# Patient Record
Sex: Male | Born: 1985 | Race: White | Hispanic: No | Marital: Single | Smoking: Current every day smoker
Health system: Southern US, Community
[De-identification: ages and names within clinical notes are randomized; demographics above are authoritative.]

---
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
## 2008-07-11 ENCOUNTER — Emergency Department (HOSPITAL_COMMUNITY): Admission: EM | Admit: 2008-07-11 | Discharge: 2008-07-11 | Payer: Self-pay | Admitting: Emergency Medicine

## 2012-10-11 ENCOUNTER — Emergency Department (HOSPITAL_COMMUNITY)
Admission: EM | Admit: 2012-10-11 | Discharge: 2012-10-11 | Disposition: A | Discharge status: Home / Self Care | Payer: Self-pay | Attending: Emergency Medicine | Admitting: Emergency Medicine

## 2012-10-11 ENCOUNTER — Encounter (HOSPITAL_COMMUNITY): Payer: Self-pay | Admitting: Emergency Medicine

## 2012-10-11 DIAGNOSIS — Y9389 Activity, other specified: Secondary | ICD-10-CM | POA: Insufficient documentation

## 2012-10-11 DIAGNOSIS — F17200 Nicotine dependence, unspecified, uncomplicated: Secondary | ICD-10-CM | POA: Insufficient documentation

## 2012-10-11 DIAGNOSIS — S30860A Insect bite (nonvenomous) of lower back and pelvis, initial encounter: Secondary | ICD-10-CM | POA: Insufficient documentation

## 2012-10-11 DIAGNOSIS — W57XXXA Bitten or stung by nonvenomous insect and other nonvenomous arthropods, initial encounter: Secondary | ICD-10-CM | POA: Insufficient documentation

## 2012-10-11 DIAGNOSIS — Y92009 Unspecified place in unspecified non-institutional (private) residence as the place of occurrence of the external cause: Secondary | ICD-10-CM | POA: Insufficient documentation

## 2012-10-11 NOTE — ED Notes (Signed)
Pt to ED with c/o tick bite at posterior penis. Pt states he was doing yard work yesterday when he was bitten by tick. Pt states he tried to removed but rest of tick broken in. Pt denies pain or itch.

## 2012-10-11 NOTE — ED Provider Notes (Signed)
Medical screening examination/treatment/procedure(s) were performed by non-physician practitioner and as supervising physician I was immediately available for consultation/collaboration.  Flint Melter, MD 10/11/12 [phone]

## 2012-10-11 NOTE — ED Provider Notes (Signed)
History     CSN: [id number]  Arrival date & time 10/11/12  0929   First MD Initiated Contact with Patient 10/11/12 0957      Chief Complaint  Patient presents with  . Tick Removal    (Consider location/radiation/quality/duration/timing/severity/associated sxs/prior treatment) HPI Comments: 27 year old male presents emergency department with a tick bite on his penis. Patient states he was doing yard work yesterday and believes he was bitten by a tick, later in the evening he noticed this in the shower. He tried to remove this on his own, however states he feels as if they're still part of it and her. Denies pain, redness, swelling or any other symptoms. The tick was very small, does not know what type of take it was.  The history is provided by the patient.    History reviewed. No pertinent past medical history.  History reviewed. No pertinent past surgical history.  History reviewed. No pertinent family history.  History  Substance Use Topics  . Smoking status: Current Every Day Smoker -- 1.00 packs/day    Types: Cigarettes  . Smokeless tobacco: Not on file  . Alcohol Use: Yes     Comment: occ      Review of Systems  Skin: Positive for wound.  All other systems reviewed and are negative.    Allergies  Review of patient's allergies indicates no known allergies.  Home Medications   Current Outpatient Rx  Name  Route  Sig  Dispense  Refill  . ibuprofen (ADVIL,MOTRIN) 200 MG tablet   Oral   Take 600 mg by mouth every 6 (six) hours as needed for pain.           There were no vitals taken for this visit.  Physical Exam  Nursing note and vitals reviewed. Constitutional: He is oriented to person, place, and time. He appears well-developed and well-nourished. No distress.  HENT:  Head: Normocephalic and atraumatic.  Mouth/Throat: Oropharynx is clear and moist.  Eyes: Conjunctivae are normal.  Neck: Normal range of motion. Neck supple.  Cardiovascular: Normal  rate, regular rhythm, normal heart sounds and intact distal pulses.   Pulmonary/Chest: Effort normal and breath sounds normal. No respiratory distress.  Genitourinary:  1 mm area of erythema on posterior aspect of penis with remnants of tick centrally. Tick removed, no surrounding edema. No tenderness.   Musculoskeletal: Normal range of motion. He exhibits no edema.  Neurological: He is alert and oriented to person, place, and time.  Skin: Skin is warm and dry. He is not diaphoretic.  Psychiatric: He has a normal mood and affect. His behavior is normal.    ED Course  Procedures (including critical care time) Tick removed with forceps. Labs Reviewed - No data to display No results found.   1. Tick bite       MDM  27 y/o male with tick bite to penis. Tick removed. No other symptoms present. I discussed all the symptoms to watch for with developing infection or Lyme disease. Patient states understanding of these instructions to return to the emergency department. He is in no apparent distress. Stable for discharge.        Trevor Mace, PA-C 10/11/12 1018

## 2014-01-28 ENCOUNTER — Emergency Department (HOSPITAL_COMMUNITY)
Admission: EM | Admit: 2014-01-28 | Discharge: 2014-01-28 | Disposition: A | Discharge status: Home / Self Care | Payer: Self-pay | Attending: Emergency Medicine | Admitting: Emergency Medicine

## 2014-01-28 ENCOUNTER — Encounter (HOSPITAL_COMMUNITY): Payer: Self-pay | Admitting: Emergency Medicine

## 2014-01-28 DIAGNOSIS — K089 Disorder of teeth and supporting structures, unspecified: Secondary | ICD-10-CM | POA: Insufficient documentation

## 2014-01-28 DIAGNOSIS — K0889 Other specified disorders of teeth and supporting structures: Secondary | ICD-10-CM

## 2014-01-28 DIAGNOSIS — K029 Dental caries, unspecified: Secondary | ICD-10-CM | POA: Insufficient documentation

## 2014-01-28 DIAGNOSIS — K006 Disturbances in tooth eruption: Secondary | ICD-10-CM | POA: Insufficient documentation

## 2014-01-28 DIAGNOSIS — F17200 Nicotine dependence, unspecified, uncomplicated: Secondary | ICD-10-CM | POA: Insufficient documentation

## 2014-01-28 MED ORDER — NAPROXEN 500 MG PO TABS: 500.0000 mg | ORAL_TABLET | Freq: Two times a day (BID) | ORAL | Status: AC

## 2014-01-28 MED ORDER — HYDROCODONE-ACETAMINOPHEN 5-325 MG PO TABS
2.0000 | ORAL_TABLET | Freq: Once | ORAL | Status: AC
  Administered 2014-01-28: 2 via ORAL
  Filled 2014-01-28: qty 2

## 2014-01-28 MED ORDER — PENICILLIN V POTASSIUM 500 MG PO TABS: 500.0000 mg | ORAL_TABLET | Freq: Four times a day (QID) | ORAL | Status: AC

## 2014-01-28 NOTE — ED Notes (Signed)
The patient said his tooth on the top left has been "rotting off" for aobut a year.  He said his pain is severe and has gotten worse.  He is here to get help with controlling his pain.

## 2014-01-28 NOTE — ED Provider Notes (Signed)
CSN: [id number]     Arrival date & time 01/28/14  2021 History   First MD Initiated Contact with Patient 01/28/14 2044     Chief Complaint  Patient presents with  . Dental Pain    The patient said his tooth on the top left has been "rotting off" for aobut a year.  He said his pain is severe and has gotten worse.     (Consider location/radiation/quality/duration/timing/severity/associated sxs/prior Treatment) HPI Comments: Is a 28 year old male who presents to the emergency department for left upper dentalgia. Patient states that his tooth has been hurting intermittently over the last 9 months. He states that most recently it has been hurting since yesterday. Patient describes the pain as an ache. He denies taking anything for pain, but usually takes ibuprofen or Tylenol. Patient denies any trauma or injury to his mouth. He further denies associated fever, inability to open his jaw, difficulty swallowing or drooling, purulent discharge in the mouth, oral bleeding, or neck pain/stiffness. Patient states his last dental exam was 22 years ago. Patient is a current daily smoker.  Patient is a 28 y.o. male presenting with tooth pain. The history is provided by the patient. No language interpreter was used.  Dental Pain   History reviewed. No pertinent past medical history. History reviewed. No pertinent past surgical history. History reviewed. No pertinent family history. History  Substance Use Topics  . Smoking status: Current Every Day Smoker -- 1.00 packs/day    Types: Cigarettes  . Smokeless tobacco: Never Used  . Alcohol Use: Yes     Comment: occ    Review of Systems  HENT: Positive for dental problem.   All other systems reviewed and are negative.    Allergies  Review of patient's allergies indicates no known allergies.  Home Medications   Prior to Admission medications   Medication Sig Start Date End Date Taking? Authorizing Provider  ibuprofen (ADVIL,MOTRIN) 200 MG tablet  Take 600 mg by mouth every 6 (six) hours as needed for pain.    Historical Provider, MD  naproxen (NAPROSYN) 500 MG tablet Take 1 tablet (500 mg total) by mouth 2 (two) times daily. 01/28/14   Antony MaduraKelly Derold Dorsch, PA-C  penicillin v potassium (VEETID) 500 MG tablet Take 1 tablet (500 mg total) by mouth 4 (four) times daily. 01/28/14 02/04/14  Antony MaduraKelly Gee Habig, PA-C   BP 152/86  Pulse 80  Temp(Src) 97.8 F (36.6 C) (Oral)  Resp 16  Ht 6\' 7"  (2.007 m)  Wt 195 lb (88.451 kg)  BMI 21.96 kg/m2  SpO2 99%  Physical Exam  Nursing note and vitals reviewed. Constitutional: He is oriented to person, place, and time. He appears well-developed and well-nourished. No distress.  Nontoxic/nonseptic appearing  HENT:  Head: Normocephalic and atraumatic.  Mouth/Throat: Uvula is midline, oropharynx is clear and moist and mucous membranes are normal. No oral lesions. No trismus in the jaw. Abnormal dentition. Dental caries present. No dental abscesses or uvula swelling.    Tenderness to palpation of left upper first molar. No gingival fluctuance. Oropharynx clear. No trismus. Patient tolerating secretions without difficulty.  Eyes: Conjunctivae and EOM are normal. No scleral icterus.  Neck: Normal range of motion. Neck supple.  No stridor  Pulmonary/Chest: Effort normal. No respiratory distress. He has no wheezes.  Musculoskeletal: Normal range of motion.  Neurological: He is alert and oriented to person, place, and time. He exhibits normal muscle tone. Coordination normal.  Skin: Skin is warm and dry. No rash noted. He is not  diaphoretic. No erythema. No pallor.  Psychiatric: He has a normal mood and affect. His behavior is normal.    ED Course  Procedures (including critical care time) Labs Review Labs Reviewed - No data to display  Imaging Review No results found.   EKG Interpretation None      MDM   Final diagnoses:  Dentalgia  Dental caries    Patient with toothache x 9 months intermittently,  most recently recurred x 1 day. No gross abscess. Exam unconcerning for Ludwig's angina or spread of infection. Will treat with penicillin and pain medicine. Urged patient to follow-up with dentist. Referral to oral surgery and resource guide provided. Return precautions discussed and patient agreeable to plan with no unaddressed concerns.   Filed Vitals:   01/28/14 2027  BP: 152/86  Pulse: 80  Temp: 97.8 F (36.6 C)  TempSrc: Oral  Resp: 16  Height: 6\' 7"  (2.007 m)  Weight: 195 lb (88.451 kg)  SpO2: 99%       Antony Madura, PA-C 01/28/14 2058

## 2014-01-28 NOTE — Discharge Instructions (Signed)
Dental Pain °A tooth ache may be caused by cavities (tooth decay). Cavities expose the nerve of the tooth to air and hot or cold temperatures. It may come from an infection or abscess (also called a boil or furuncle) around your tooth. It is also often caused by dental caries (tooth decay). This causes the pain you are having. °DIAGNOSIS  °Your caregiver can diagnose this problem by exam. °TREATMENT  °· If caused by an infection, it may be treated with medications which kill germs (antibiotics) and pain medications as prescribed by your caregiver. Take medications as directed. °· Only take over-the-counter or prescription medicines for pain, discomfort, or fever as directed by your caregiver. °· Whether the tooth ache today is caused by infection or dental disease, you should see your dentist as soon as possible for further care. °SEEK MEDICAL CARE IF: °The exam and treatment you received today has been provided on an emergency basis only. This is not a substitute for complete medical or dental care. If your problem worsens or new problems (symptoms) appear, and you are unable to meet with your dentist, call or return to this location. °SEEK IMMEDIATE MEDICAL CARE IF:  °· You have a fever. °· You develop redness and swelling of your face, jaw, or neck. °· You are unable to open your mouth. °· You have severe pain uncontrolled by pain medicine. °MAKE SURE YOU:  °· Understand these instructions. °· Will watch your condition. °· Will get help right away if you are not doing well or get worse. °Document Released: 05/30/2005 Document Revised: 08/22/2011 Document Reviewed: 01/16/2008 °ExitCare® Patient Information ©2015 [employer]. This information is not intended to replace advice given to you by your health care provider. Make sure you discuss any questions you have with your health care provider. ° °Emergency Department Resource Guide °1) Find a Doctor and Pay Out of Pocket °Although you won't have to find out who  is covered by your insurance plan, it is a good idea to ask around and get recommendations. You will then need to call the office and see if the doctor you have chosen will accept you as a new patient and what types of options they offer for patients who are self-pay. Some doctors offer discounts or will set up payment plans for their patients who do not have insurance, but you will need to ask so you aren't surprised when you get to your appointment. ° °2) Contact Your Local Health Department °Not all health departments have doctors that can see patients for sick visits, but many do, so it is worth a call to see if yours does. If you don't know where your local health department is, you can check in your phone book. The CDC also has a tool to help you locate your state's health department, and many state websites also have listings of all of their local health departments. ° °3) Find a Walk-in Clinic °If your illness is not likely to be very severe or complicated, you may want to try a walk in clinic. These are popping up all over the country in pharmacies, drugstores, and shopping centers. They're usually staffed by nurse practitioners or physician assistants that have been trained to treat common illnesses and complaints. They're usually fairly quick and inexpensive. However, if you have serious medical issues or chronic medical problems, these are probably not your best option. ° °No Primary Care Doctor: °- Call [facility] at  832-8000 - they can help you locate a primary   care doctor that  accepts your insurance, provides certain services, etc. °- Physician Referral Service- 1-800-533-3463 ° °Chronic Pain Problems: °Organization         Address  Phone   Notes  °[facility]  (336) 297-2271 Patients need to be referred by their primary care doctor.  ° °Medication Assistance: °Organization         Address  Phone   Notes  °[place] [facility] [address]  [address] °[place], [place] [address] (336) 641-8030 --Must be a resident of [place] °-- Must have NO insurance coverage whatsoever (no Medicaid/ Medicare, etc.) °-- The pt. MUST have a primary care doctor that directs their care regularly and follows them in the community °  °[facility]  (866) 331-1348   °[employer]  (888) 892-1162   ° °Agencies that provide inexpensive medical care: °Organization         Address  Phone   Notes  °[facility]  (336) 832-8035   °[facility]    (336) 832-7272   °[facility] [address] °[place], [place] [address] (336) 832-4777   °[facility] [address], °[place] (336) 271-4999   °[facility]    (336) 373-0678   °[facility]    (336) 272-1050   °[facility] ° [address], [place] Phone:  (336) 832-4444, Fax:  (336) 832-4440 Hours of Operation:  9 am - 6 pm, M-F.  Also accepts Medicaid/Medicare and self-pay.  °[facility] ° [address], [place] Phone: (336) 832-3150, Fax: (336) 832-3151. Hours of Operation:  8:30 am - 5:30 pm, M-F.  Also accepts Medicaid and self-pay.  °[facility] [place] [address], [place] Phone: (336) 878-6027   °[facility] [address], [place], [place] (336)723-1848, Ext. 123 Mondays & Thursdays: 7-9 AM.  First 15 patients are seen on a first come, first serve basis. °  ° °Medicaid-accepting [place] Providers: ° °Organization         Address  Phone   Notes  °[facility] [address], [place] (336) 641-2100 Also accepts self-pay patients.  °[facility] [address], [place] ° (336) 856-9996   °[facility] [address], [place] (336) 288-8857   °[facility] [address] [place] [address], [place] (336) 299-7000   °Veita Bland [address], [place]  ° (336) 373-1557 Only accepts [place] Access Medicaid patients after they have their name applied to their card.  ° °Self-Pay (no insurance) in [place]: ° °Organization         Address  Phone   Notes  °Sickle Cell Patients, [facility] [address], [place] (336) 832-1970   °[facility] [address], [place] (336) 832-4400   °[facility] ° [address], [place] (336) 992-4800   °[facility]/Dr. Osei-Bonsu ° [address] [place] [address], [place] or [address], [place] (336) 841-8500 Phone number for both [place] and [place] locations is the same.  °[facility] [address], [place] (336) 299-0000   °[facility] [address] [place] [address], [place] or [address] (336) 852-7530 °(336) 878-2260   °  [facility] [address], [place] (336) 350-1642, phone; (336) 294-5005, fax Sees patients 1st and 3rd Saturday of every month.  Must not qualify for public or private insurance (i.e. Medicaid, Medicare, [place] Health Choice, Veterans' Benefits) • Household income should be no more than 200% of the poverty level •The clinic cannot treat you if you are pregnant or think you are pregnant • Sexually transmitted diseases are not treated at the clinic.  ° ° °Dental Care: °Organization         Address  Phone  Notes  °[facility] [facility] [address], [place] (336) 641-6152 Accepts children up to age 21 who are enrolled in Medicaid or [place] Health Choice; pregnant women with a Medicaid card; and children who have applied for Medicaid or [place] Health Choice, but were declined, whose parents can pay a reduced fee at time of service.  °[facility] [place]  [address], [place] (336) 641-7733 Accepts children up to age 21 who are  enrolled in Medicaid or [place] Health Choice; pregnant women with a Medicaid card; and children who have applied for Medicaid or [place] Health Choice, but were declined, whose parents can pay a reduced fee at time of service.  °[facility] ° [address], [place] (336) 641-4533 Patients are seen by appointment only. Walk-ins are not accepted. [facility] will see patients 18 years of age and older. °Monday - Tuesday (8am-5pm) °Most Wednesdays (8:30-5pm) °$30 per visit, cash only  °[facility] ° [address], [place] (336) 641-4533 Patients are seen by appointment only. Walk-ins are not accepted. [facility] will see patients 18 years of age and older. °One Wednesday Evening (Monthly: Volunteer Based).  $30 per visit, cash only  °[facility]  (919) 537-3737 for adults; Children under age 4, call Graduate Pediatric Dentistry at (919) [phone]. Children aged 4-14, please call (919) [phone] to request a pediatric application. ° Dental services are provided in all areas of dental care including fillings, crowns and bridges, complete and partial dentures, implants, gum treatment, root canals, and extractions. Preventive care is also provided. Treatment is provided to both adults and children. °Patients are selected via a lottery and there is often a waiting list. °  °[facility] [address], ° ° (336) 763-8833 [email or web address] °  °[facility] [address], [place], [place] (336)723-1848, Ext. 123 Second and Fourth Thursday of each month, opens at 6:30 AM; Clinic ends at 9 AM.  Patients are seen on a first-come first-served basis, and a limited number are seen during each clinic.  ° °[facility] ° [address], [place], [place] (336) 723-7904   Eligibility Requirements °You must have lived in [place], [place], or [place] counties for at least the last three months. °  You  cannot be eligible for state or federal sponsored healthcare insurance, including Veterans Administration, Medicaid, or Medicare. °  You generally cannot be eligible for healthcare insurance through your employer.  °  How to apply: °Eligibility screenings are held every Tuesday and Wednesday afternoon from 1:00 pm until 4:00 pm. You do not need an appointment for the interview!  °[facility] [address], [place], [place] 336-631-2330   °[facility]  336-342-8273   °[facility]  336-703-3100   °[facility]  336-570-6415   ° °Behavioral Health Resources in the Community: °Intensive Outpatient Programs °Organization         Address  Phone  Notes  °[facility] [address], [place], [place] 336-878-6098   °[facility] [address], [place], [place] 336-832-9800   °[facility] [address], [place], [place] ° 336-882-2125   °[facility] [address],  °[place], [place] 1-800-853-5163 or 336-641-4981   °Substance Abuse Resources °Organization         Address  Phone  Notes  °[facility]  336-882-2125   °[facility]  336-784-9470   °[facility]  336-285-9073   °[facility]  336-845-3988   °Residential & Outpatient Substance Abuse Program  1-800-659-3381   °Psychological Services °Organization         Address  Phone  Notes  °[facility]  336- 832-9600   °[facility]  336- 378-7881   °[facility] [address], [place] 1-800-853-5163 or 336-641-4981   ° °Mobile Crisis Teams °Organization         Address  Phone  Notes  °[facility], Mobile Crisis Care Unit  1-877-626-1772   °Assertive °[facility] ° [address] [place], [place] 336-834-9664   °Sharon DeEsch [address] °[place] 336-554-5454   ° °Self-Help/Support  Groups °Organization         Address  Phone             Notes  °[facility] [place] - variety of support groups  336- 373-1402 Call for more information  °Narcotics Anonymous (NA), [facility] [address], °[place]  2 meetings at this location  ° °Residential Treatment Programs °Organization         Address  Phone  Notes  °[facility] [address],    °[place]  1-866-801-8205   °[facility] ° [address], [place], [place] 704-293-8524   °[facility] [address], [place] 336-845-3988 Admissions: 8am-3pm M-F  °[facility] [address]    °[place], [place] 336-841-1104   °[facility] [address], [place], [place] 336-379-7146   °[facility] [address]  °[place], [place] 336-285-9073   °[facility] - Intensive Outpatient [address], [place], [place] 336-852-3033   °[facility] [address]  °[place], [place] 1-877-615-2722 or 336-784-9470   °[facility] [address] [place], [place] 336-227-7417 Accepts Medicaid  °[facility] [address]  °[place] 1-800-659-3381 Substance Abuse/Addiction Treatment  ° °[place] Behavioral Health Resources °Organization         Address  Phone  Notes  °[facility]  (888) 581-9988   °Julie Brannon, PhD [address] [place], [place]   (336) 349-5553 or (336) 951-0000   °[facility]   [address] °[place], [place] (336) 349-4454   °[facility] [address], [place],  (336) 342-8316 Insurance/Medicaid/sponsorship through [facility]  °[facility] [address]                                    [place],  (336) 342-8316 Therapy/tele-psych/case  °[facility]   [address]  ° [place], [place] (336) 349-2233    °Dr. Arfeen  (336) 349-4544   °[facility] [place]  [facility]  [employer] [facility] [place] [facility] 1) [address], [place] °2) [address], [place] °3)  [address], [place] (336) 349-3220 °(336) 342-7768 ° °(336) 342-8140   °[place] [facility] (336) 342-1394 or (336) 342-3537 (After Hours)    ° ° ° °

## 2014-01-29 NOTE — ED Provider Notes (Signed)
Medical screening examination/treatment/procedure(s) were performed by non-physician practitioner and as supervising physician I was immediately available for consultation/collaboration.   EKG Interpretation None        David H Yao, MD 01/29/14 1051 

## 2016-11-24 ENCOUNTER — Emergency Department (HOSPITAL_COMMUNITY): Payer: Self-pay

## 2016-11-24 ENCOUNTER — Emergency Department (HOSPITAL_COMMUNITY)
Admission: EM | Admit: 2016-11-24 | Discharge: 2016-11-25 | Disposition: A | Discharge status: Home / Self Care | Payer: Self-pay | Attending: Emergency Medicine | Admitting: Emergency Medicine

## 2016-11-24 ENCOUNTER — Encounter (HOSPITAL_COMMUNITY): Payer: Self-pay

## 2016-11-24 DIAGNOSIS — F17210 Nicotine dependence, cigarettes, uncomplicated: Secondary | ICD-10-CM | POA: Insufficient documentation

## 2016-11-24 DIAGNOSIS — R079 Chest pain, unspecified: Secondary | ICD-10-CM

## 2016-11-24 DIAGNOSIS — R0789 Other chest pain: Secondary | ICD-10-CM | POA: Insufficient documentation

## 2016-11-24 LAB — CBC WITH DIFFERENTIAL/PLATELET
Basophils Absolute: 0.1 10*3/uL (ref 0.0–0.1)
Basophils Relative: 1 %
Eosinophils Absolute: 0.1 10*3/uL (ref 0.0–0.7)
Eosinophils Relative: 1 %
HCT: 47.8 % (ref 39.0–52.0)
Hemoglobin: 16.6 g/dL (ref 13.0–17.0)
Lymphocytes Relative: 18 %
Lymphs Abs: 1.9 10*3/uL (ref 0.7–4.0)
MCH: 32.3 pg (ref 26.0–34.0)
MCHC: 34.7 g/dL (ref 30.0–36.0)
MCV: 93 fL (ref 78.0–100.0)
Monocytes Absolute: 0.6 10*3/uL (ref 0.1–1.0)
Monocytes Relative: 6 %
Neutro Abs: 7.9 10*3/uL — ABNORMAL HIGH (ref 1.7–7.7)
Neutrophils Relative %: 74 %
Platelets: 220 10*3/uL (ref 150–400)
RBC: 5.14 MIL/uL (ref 4.22–5.81)
RDW: 13.3 % (ref 11.5–15.5)
WBC: 10.6 10*3/uL — ABNORMAL HIGH (ref 4.0–10.5)

## 2016-11-24 LAB — COMPREHENSIVE METABOLIC PANEL
ALT: 39 U/L (ref 17–63)
AST: 41 U/L (ref 15–41)
Albumin: 4.1 g/dL (ref 3.5–5.0)
Alkaline Phosphatase: 67 U/L (ref 38–126)
Anion gap: 12 (ref 5–15)
BUN: 8 mg/dL (ref 6–20)
CO2: 21 mmol/L — ABNORMAL LOW (ref 22–32)
Calcium: 9.3 mg/dL (ref 8.9–10.3)
Chloride: 104 mmol/L (ref 101–111)
Creatinine, Ser: 0.86 mg/dL (ref 0.61–1.24)
GFR calc Af Amer: >60 mL/min (ref >60)
GFR calc non Af Amer: >60 mL/min (ref >60)
Glucose, Bld: 134 mg/dL — ABNORMAL HIGH (ref 65–99)
Potassium: 3.6 mmol/L (ref 3.5–5.1)
Sodium: 137 mmol/L (ref 135–145)
Total Bilirubin: 0.4 mg/dL (ref 0.3–1.2)
Total Protein: 7.1 g/dL (ref 6.5–8.1)

## 2016-11-24 LAB — LIPASE, BLOOD: LIPASE: 32 U/L (ref 11–51)

## 2016-11-24 LAB — URINALYSIS, ROUTINE W REFLEX MICROSCOPIC
Bilirubin Urine: NEGATIVE
Glucose, UA: NEGATIVE mg/dL
Hgb urine dipstick: NEGATIVE
Ketones, ur: NEGATIVE mg/dL
Nitrite: NEGATIVE
Protein, ur: NEGATIVE mg/dL
Specific Gravity, Urine: 1.015 (ref 1.005–1.030)
Squamous Epithelial / LPF: NONE SEEN
pH: 5 (ref 5.0–8.0)

## 2016-11-24 LAB — I-STAT CG4 LACTIC ACID, ED
Lactic Acid, Venous: 0.43 mmol/L — ABNORMAL LOW (ref 0.5–1.9)
Lactic Acid, Venous: 1.72 mmol/L (ref 0.5–1.9)

## 2016-11-24 LAB — TROPONIN I: Troponin I: <0.03 ng/mL (ref <0.03)

## 2016-11-24 MED ORDER — SODIUM CHLORIDE 0.9 % IV BOLUS (SEPSIS)
1000.0000 mL | Freq: Once | INTRAVENOUS | Status: AC
  Administered 2016-11-24: 1000 mL via INTRAVENOUS

## 2016-11-24 MED ORDER — HYDROMORPHONE HCL 1 MG/ML IJ SOLN
0.5000 mg | Freq: Once | INTRAMUSCULAR | Status: AC
  Administered 2016-11-24: 0.5 mg via INTRAVENOUS
  Filled 2016-11-24: qty 1

## 2016-11-24 MED ORDER — KETOROLAC TROMETHAMINE 15 MG/ML IJ SOLN
15.0000 mg | Freq: Once | INTRAMUSCULAR | Status: AC
  Administered 2016-11-24: 15 mg via INTRAVENOUS
  Filled 2016-11-24: qty 1

## 2016-11-24 MED ORDER — IOPAMIDOL (ISOVUE-370) INJECTION 76%
INTRAVENOUS | Status: AC
  Administered 2016-11-25: 100 mL
  Filled 2016-11-24: qty 100

## 2016-11-24 NOTE — ED Triage Notes (Addendum)
Pt presents with c/o right rib/side pain. The pain began last week. He reports fevers, cough, shortness of breath, nausea, vomiting. He denies abdominal pain, constipation, diarrhea. He denies any injuries or exertion prior to onset. He has been taking ibuprofen with minimal relief

## 2016-11-24 NOTE — ED Provider Notes (Signed)
[facility]-EMERGENCY DEPT Provider Note   CSN: [id number] Arrival date & time: 11/24/16  1825  By signing my name below, I, Linna DarnerRussell Turner, attest that this documentation has been prepared under the direction and in the presence of physician practitioner, Raeford RazorKohut, Johnsie Moscoso, MD. Electronically Signed: Linna Darnerussell Turner, Scribe. 11/24/2016. 10:01 PM.  History   Chief Complaint Chief Complaint  Patient presents with  . Abdominal Pain   The history is provided by the patient. No language interpreter was used.    HPI Comments: Don Mendez is a 31 y.o. male with a history of kidney stones who presents to the Emergency Department complaining of persistent, gradually worsening, severe RUQ pain for about 10 days. He states it intermittently feels like there is a "knot" in his RUQ. He has tried ibuprofen without improvement of his pain. Patient notes his pain is worsened by respiration and certain positions and is not modified post-prandially. He is also reporting some associated dyspnea on exertion and intermittent headaches. Patient states his current pain is different from pain that he experienced with his kidney stones. No recent surgeries or immobilizations. Patient denies numbness/tingling, focal weakness, nausea, vomiting, or any other associated symptoms.   History reviewed. No pertinent past medical history.  There are no active problems to display for this patient.   History reviewed. No pertinent surgical history.     Home Medications    Prior to Admission medications   Medication Sig Start Date End Date Taking? Authorizing Provider  aspirin-acetaminophen-caffeine (EXCEDRIN MIGRAINE) [id number] MG tablet Take 1-2 tablets by mouth every 6 (six) hours as needed for headache.   Yes [provider]  ibuprofen (ADVIL,MOTRIN) 200 MG tablet Take 800 mg by mouth every 6 (six) hours as needed (for headaches).    Yes [provider]  naproxen (NAPROSYN) 500 MG tablet Take 1  tablet (500 mg total) by mouth 2 (two) times daily. Patient not taking: Reported on 11/24/2016 01/28/14   Antony MaduraHumes, Kelly, PA-C    Family History History reviewed. No pertinent family history.  Social History Social History  Substance Use Topics  . Smoking status: Current Every Day Smoker    Packs/day: 1.00    Types: Cigarettes  . Smokeless tobacco: Never Used  . Alcohol use Yes     Comment: occ     Allergies   Patient has no known allergies.   Review of Systems Review of Systems  Respiratory: Positive for shortness of breath.   Gastrointestinal: Positive for abdominal pain. Negative for nausea and vomiting.  Neurological: Positive for headaches. Negative for weakness and numbness.  All other systems reviewed and are negative.  Physical Exam Updated Vital Signs BP 123/68 (BP Location: Left Arm)   Pulse (!) 103   Temp 98.7 F (37.1 C) (Oral)   Resp 14   SpO2 100%   Physical Exam  Constitutional: He appears well-developed and well-nourished.  HENT:  Head: Normocephalic.  Right Ear: External ear normal.  Left Ear: External ear normal.  Nose: Nose normal.  Eyes: Conjunctivae are normal. Right eye exhibits no discharge. Left eye exhibits no discharge.  Neck: Normal range of motion.  Cardiovascular: Normal rate, regular rhythm and normal heart sounds.   No murmur heard. Pulmonary/Chest: Effort normal and breath sounds normal. No respiratory distress. He has no wheezes. He has no rales.  Abdominal: Soft. There is tenderness. There is no rebound and no guarding.  Tender in RUQ. No rebound or guarding.   Musculoskeletal: Normal range of motion. He exhibits no edema  or tenderness.  Neurological: He is alert. No cranial nerve deficit. Coordination normal.  Skin: Skin is warm and dry. No rash noted. No erythema. No pallor.  Psychiatric: He has a normal mood and affect. His behavior is normal.  Nursing note and vitals reviewed.  ED Treatments / Results  Labs (all labs  ordered are listed, but only abnormal results are displayed) Labs Reviewed  COMPREHENSIVE METABOLIC PANEL - Abnormal; Notable for the following:       Result Value   CO2 21 (*)    Glucose, Bld 134 (*)    All other components within normal limits  CBC WITH DIFFERENTIAL/PLATELET - Abnormal; Notable for the following:    WBC 10.6 (*)    Neutro Abs 7.9 (*)    All other components within normal limits  URINALYSIS, ROUTINE W REFLEX MICROSCOPIC - Abnormal; Notable for the following:    APPearance HAZY (*)    Leukocytes, UA TRACE (*)    Bacteria, UA RARE (*)    All other components within normal limits  TROPONIN I  LIPASE, BLOOD  I-STAT CG4 LACTIC ACID, ED  I-STAT CG4 LACTIC ACID, ED    EKG  EKG Interpretation  Date/Time:  Thursday November 24 2016 18:44:51 EDT Ventricular Rate:  117 PR Interval:  150 QRS Duration: 96 QT Interval:  332 QTC Calculation: 463 R Axis:   72 Text Interpretation:  Sinus tachycardia Cannot rule out Inferior infarct , age undetermined Abnormal ECG Confirmed by Juleen China  MD, Enzley Kitchens [phone]) on 11/24/2016 11:33:51 PM       Radiology Dg Chest 2 View  Result Date: 11/24/2016 CLINICAL DATA:  Right anterior low rib pain for 1 week. EXAM: CHEST  2 VIEW COMPARISON:  07/11/2008 FINDINGS: The heart size and mediastinal contours are within normal limits. Both lungs are clear. The visualized skeletal structures are unremarkable. IMPRESSION: No active cardiopulmonary disease. Electronically Signed   By: Gaylyn Rong M.D.   On: 11/24/2016 19:49    Procedures Procedures (including critical care time)  DIAGNOSTIC STUDIES: Oxygen Saturation is 100% on RA, normal by my interpretation.    COORDINATION OF CARE: 10:01 PM Discussed treatment plan with pt at bedside and pt agreed to plan.  Medications Ordered in ED Medications - No data to display   Initial Impression / Assessment and Plan / ED Course  I have reviewed the triage vital signs and the nursing  notes.  Pertinent labs & imaging results that were available during my care of the patient were reviewed by me and considered in my medical decision making (see chart for details).     30yM with 1w hx of R lateral CP. No obvious risk factors for PE. No signs of DVT on exam but he does have resting tachycardia, the pain is pleuritic and he reports exertional dyspnea over the same time period. CXR w/o acute findings. Will CTa. Consider biliary colic, ureteral stone, ACS, etc but I think all considerably less likely.  Final Clinical Impressions(s) / ED Diagnoses   Final diagnoses:  Right-sided chest pain    New Prescriptions New Prescriptions   No medications on file   I personally preformed the services scribed in my presence. The recorded information has been reviewed is accurate. Raeford Razor, MD.    Raeford Razor, MD 12/05/16 [phone]

## 2016-11-25 ENCOUNTER — Emergency Department (HOSPITAL_COMMUNITY): Payer: Self-pay

## 2016-11-25 ENCOUNTER — Encounter (HOSPITAL_COMMUNITY): Payer: Self-pay | Admitting: Radiology

## 2016-11-25 NOTE — ED Provider Notes (Signed)
31 yo M with right-sided pleuritic chest pain. Received in turnover from Dr. Juleen ChinaKohut.  Plan was to wait for CT angios the chest to rule out PE. Negative. Discharge home.   Don Mendez, Don Maceachern, DO 11/25/16 0106

## 2016-11-25 NOTE — ED Notes (Signed)
Patient transported to CT 

## 2016-11-25 NOTE — Discharge Instructions (Signed)
Take 4 over the counter ibuprofen tablets 3 times a day or 2 over-the-counter naproxen tablets twice a day for pain. Also take tylenol 1000mg(2 extra strength) four times a day.    

## 2016-12-02 ENCOUNTER — Encounter (HOSPITAL_COMMUNITY): Payer: Self-pay | Admitting: Emergency Medicine

## 2016-12-02 ENCOUNTER — Emergency Department (HOSPITAL_COMMUNITY)
Admission: EM | Admit: 2016-12-02 | Discharge: 2016-12-02 | Disposition: A | Discharge status: Home / Self Care | Payer: Self-pay | Attending: Emergency Medicine | Admitting: Emergency Medicine

## 2016-12-02 DIAGNOSIS — K610 Anal abscess: Secondary | ICD-10-CM | POA: Insufficient documentation

## 2016-12-02 DIAGNOSIS — F17210 Nicotine dependence, cigarettes, uncomplicated: Secondary | ICD-10-CM | POA: Insufficient documentation

## 2016-12-02 MED ORDER — BUPIVACAINE-EPINEPHRINE (PF) 0.25% -1:200000 IJ SOLN
10.0000 mL | Freq: Once | INTRAMUSCULAR | Status: AC
  Administered 2016-12-02: 10 mL
  Filled 2016-12-02: qty 10

## 2016-12-02 MED ORDER — LIDOCAINE 5 % EX OINT
TOPICAL_OINTMENT | Freq: Once | CUTANEOUS | Status: AC
Start: 2016-12-02 — End: 2016-12-02
  Administered 2016-12-02: 13:00:00 via TOPICAL
  Filled 2016-12-02: qty 35.44

## 2016-12-02 MED ORDER — MELOXICAM 15 MG PO TABS: 15.0000 mg | ORAL_TABLET | Freq: Every day | ORAL | 0 refills | Status: AC

## 2016-12-02 MED ORDER — DOCUSATE SODIUM 250 MG PO CAPS: 250.0000 mg | ORAL_CAPSULE | Freq: Every day | ORAL | 0 refills | Status: AC

## 2016-12-02 NOTE — ED Notes (Signed)
Pt verbalized understanding discharge instructions and denies any further needs or questions at this time. VS stable, ambulatory and steady gait.   

## 2016-12-02 NOTE — ED Triage Notes (Signed)
Pt states "i hven't been able to have a bowel movement in the last four days, theres a knot in my rectum and its painful. I have the urge to go but its just too painful".

## 2016-12-02 NOTE — ED Provider Notes (Signed)
[facility] Provider Note   CSN: [id number] Arrival date & time: 12/02/16  1018     History   Chief Complaint Chief Complaint  Patient presents with  . Hemorrhoids    HPI Don Mendez is a 31 y.o. male with no significant past medical history who presents the emergency department with chief complaint of hemorrhoid. He has no previous history of the same. He noticed pain above his rectum 4 days ago. He states that the pain is so significant. He has not been able to make a bowel movement because of the pain. He has pain around his anus and in his rectum. He has urgency to defecate but has not allowed himself to use the bathroom. He denies fevers or chills. He has no significant past medical history contributing to this.  HPI  History reviewed. No pertinent past medical history.  There are no active problems to display for this patient.   History reviewed. No pertinent surgical history.     Home Medications    Prior to Admission medications   Medication Sig Start Date End Date Taking? Authorizing Provider  aspirin-acetaminophen-caffeine (EXCEDRIN MIGRAINE) [phone] MG tablet Take 1-2 tablets by mouth every 6 (six) hours as needed for headache.    [provider]  ibuprofen (ADVIL,MOTRIN) 200 MG tablet Take 800 mg by mouth every 6 (six) hours as needed (for headaches).     [provider]  naproxen (NAPROSYN) 500 MG tablet Take 1 tablet (500 mg total) by mouth 2 (two) times daily. Patient not taking: Reported on 11/24/2016 01/28/14   Antony Madura, PA-C    Family History No family history on file.  Social History Social History  Substance Use Topics  . Smoking status: Current Every Day Smoker    Packs/day: 1.00    Types: Cigarettes  . Smokeless tobacco: Never Used  . Alcohol use Yes     Comment: occ     Allergies   Patient has no known allergies.   Review of Systems Review of Systems  Ten systems reviewed and are negative for  acute change, except as noted in the HPI.   Physical Exam Updated Vital Signs BP (!) 141/81   Pulse 82   Temp 98 F (36.7 C) (Oral)   Resp 16   Ht 6\' 7"  (2.007 m)   Wt 90.7 kg (200 lb)   SpO2 97%   BMI 22.53 kg/m   Physical Exam  Constitutional: He appears well-developed and well-nourished. No distress.  Appears very uncomfortable  HENT:  Head: Normocephalic and atraumatic.  Eyes: Conjunctivae are normal. No scleral icterus.  Neck: Normal range of motion. Neck supple.  Cardiovascular: Normal rate, regular rhythm and normal heart sounds.   Pulmonary/Chest: Effort normal and breath sounds normal. No respiratory distress.  Abdominal: Soft. There is no tenderness.  Genitourinary:  Genitourinary Comments: Centimeter exquisitely tender mass above his rectum, erythematous  Musculoskeletal: He exhibits no edema.  Neurological: He is alert.  Skin: Skin is warm and dry. He is not diaphoretic.  Psychiatric: His behavior is normal.  Nursing note and vitals reviewed.    ED Treatments / Results  Labs (all labs ordered are listed, but only abnormal results are displayed) Labs Reviewed - No data to display  EKG  EKG Interpretation None       Radiology No results found.  Procedures .Marland KitchenIncision and Drainage Date/Time: 12/02/2016 2:48 PM Performed by: Arthor Captain Authorized by: Arthor Captain   Consent:    Consent obtained:  Verbal  Consent given by:  Patient   Risks discussed:  Incomplete drainage, bleeding, pain, infection and damage to other organs Location:    Type:  Abscess   Size:  6 cm   Location:  Anogenital   Anogenital location:  Perianal Pre-procedure details:    Skin preparation:  Betadine Anesthesia (see MAR for exact dosages):    Anesthesia method:  Topical application and local infiltration   Topical anesthetic:  Lidocaine gel   Local anesthetic:  Bupivacaine 0.25% WITH epi Procedure type:    Complexity:  Simple Procedure details:    Incision  types:  Single straight   Incision depth:  Dermal   Scalpel blade:  11   Wound management:  Probed and deloculated and irrigated with saline   Drainage:  Purulent   Drainage amount:  Moderate   Wound treatment:  Wound left open   Packing materials:  None Post-procedure details:    Patient tolerance of procedure:  Tolerated well, no immediate complications   (including critical care time)  Medications Ordered in ED Medications  bupivacaine-epinephrine (MARCAINE W/ EPI) 0.25% -1:200000 injection 10 mL (not administered)  lidocaine (XYLOCAINE) 5 % ointment ( Topical Given 12/02/16 1253)     Initial Impression / Assessment and Plan / ED Course  I have reviewed the triage vital signs and the nursing notes.  Pertinent labs & imaging results that were available during my care of the patient were reviewed by me and considered in my medical decision making (see chart for details).      patient with large perianal abscess. Incised and drained in the ED.  I have procured an appointment in follow up for the patient with [facility] for wound check.  See d/c medicaitons.  I doubt perirectal abscess or fistula. He has a benign abdominal exam.  The patient will be discharged . Home care, sitz baths discussed qalong with strict return precatuions. Patient verbalizes understanding and agrees with POC.     Final Clinical Impressions(s) / ED Diagnoses   Final diagnoses:  Perianal abscess    New Prescriptions Discharge Medication List as of 12/02/2016  3:05 PM    START taking these medications   Details  docusate sodium (COLACE) 250 MG capsule Take 1 capsule (250 mg total) by mouth daily., Starting Fri 12/02/2016, Print    meloxicam (MOBIC) 15 MG tablet Take 1 tablet (15 mg total) by mouth daily. Take 1 daily with food., Starting Fri 12/02/2016, Print         Tiburcio PeaHarris, [place]Abig[place], PA-C 12/07/16 1600    Benjiman CorePickering, Nathan, MD 12/07/16 [phone]

## 2016-12-02 NOTE — Discharge Instructions (Signed)
Get help right away if: You have problems moving or using your legs. You have severe or increasing pain. Your swelling in the affected area suddenly gets worse. You have a large increase in bleeding or passing of pus. You have chills or a fever.

## 2018-01-30 IMAGING — CR DG CHEST 2V
2 series · 2 of 2 positions shown · non-contrast
Comparison: 07/11/2008

CLINICAL DATA: Right anterior low rib pain for 1 week.

EXAM:
CHEST  2 VIEW

[chest pa]
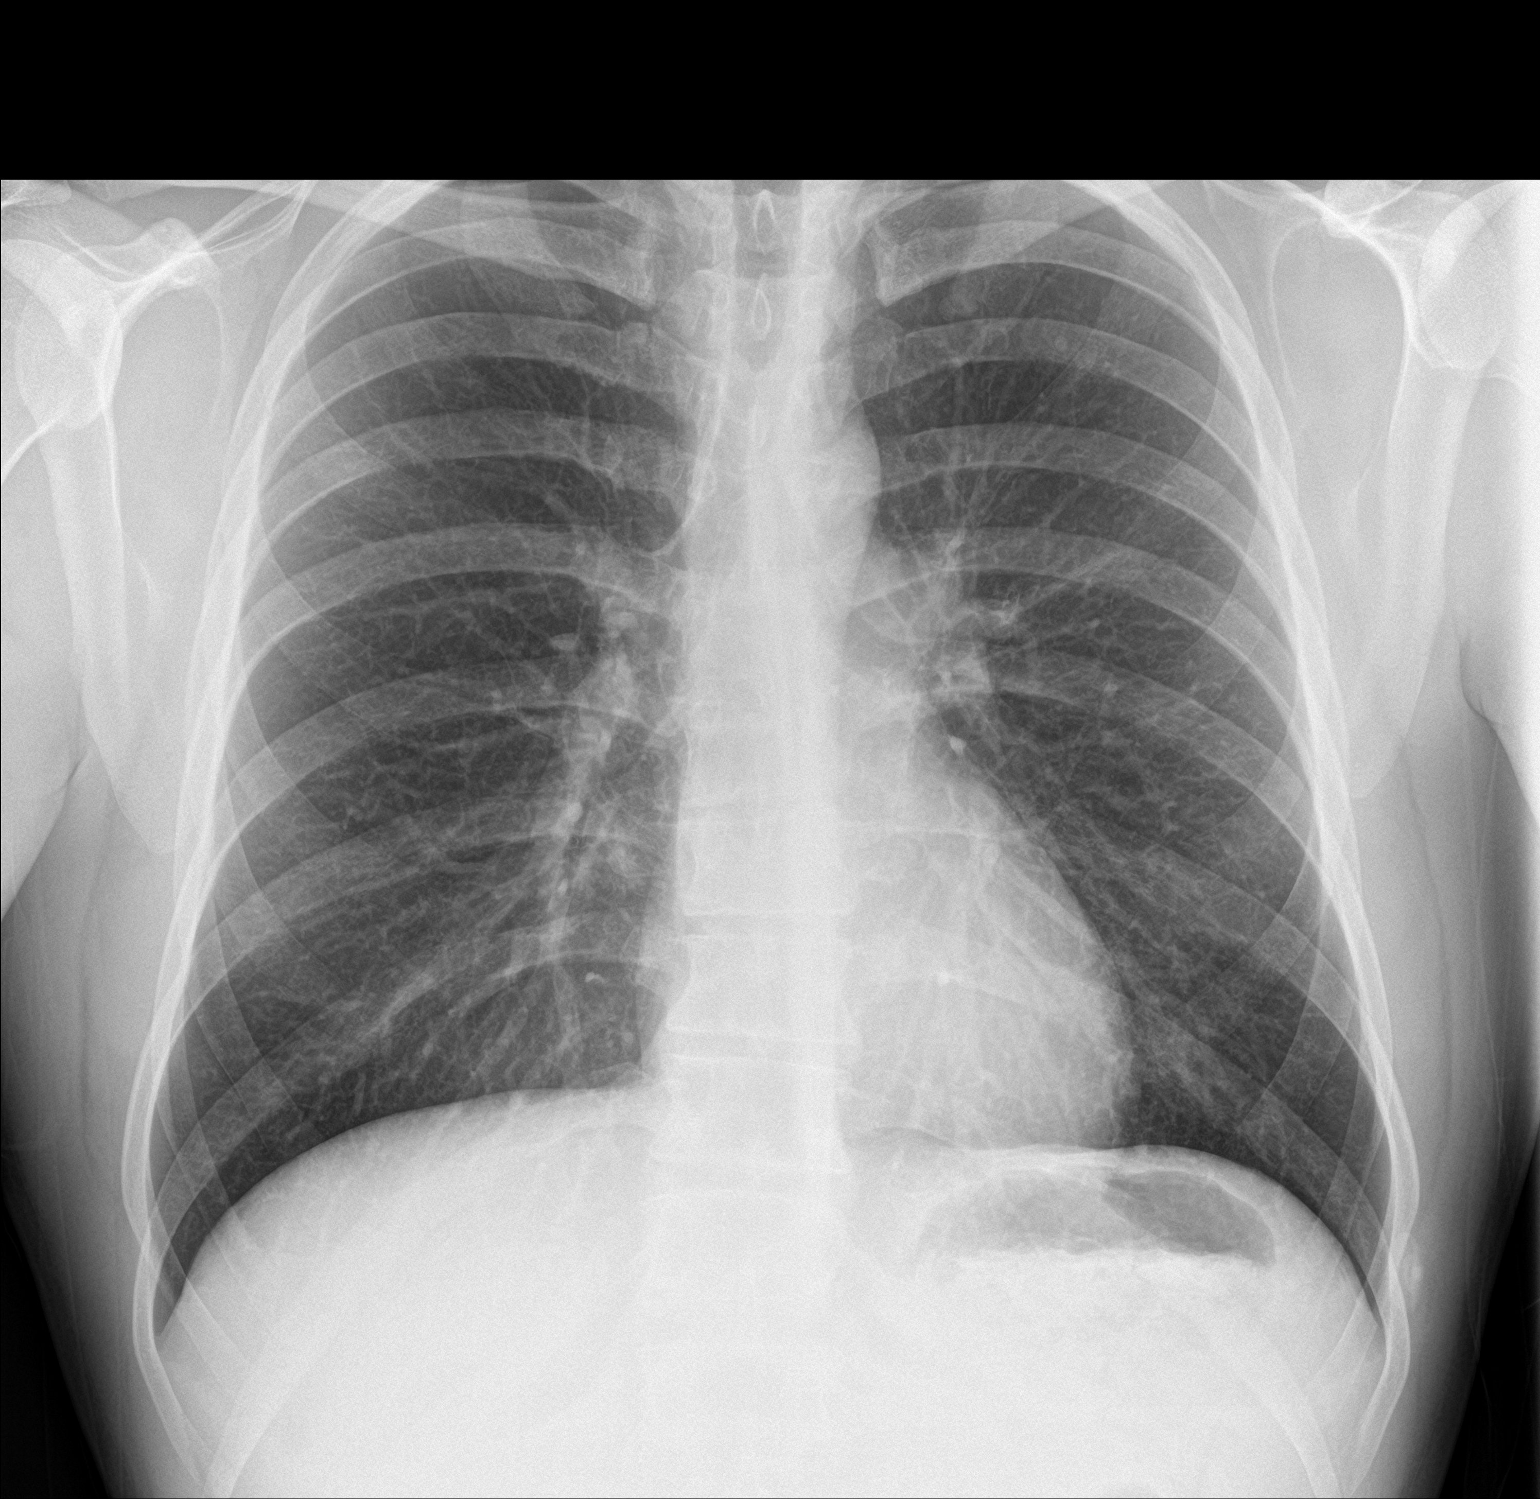

[chest lat]
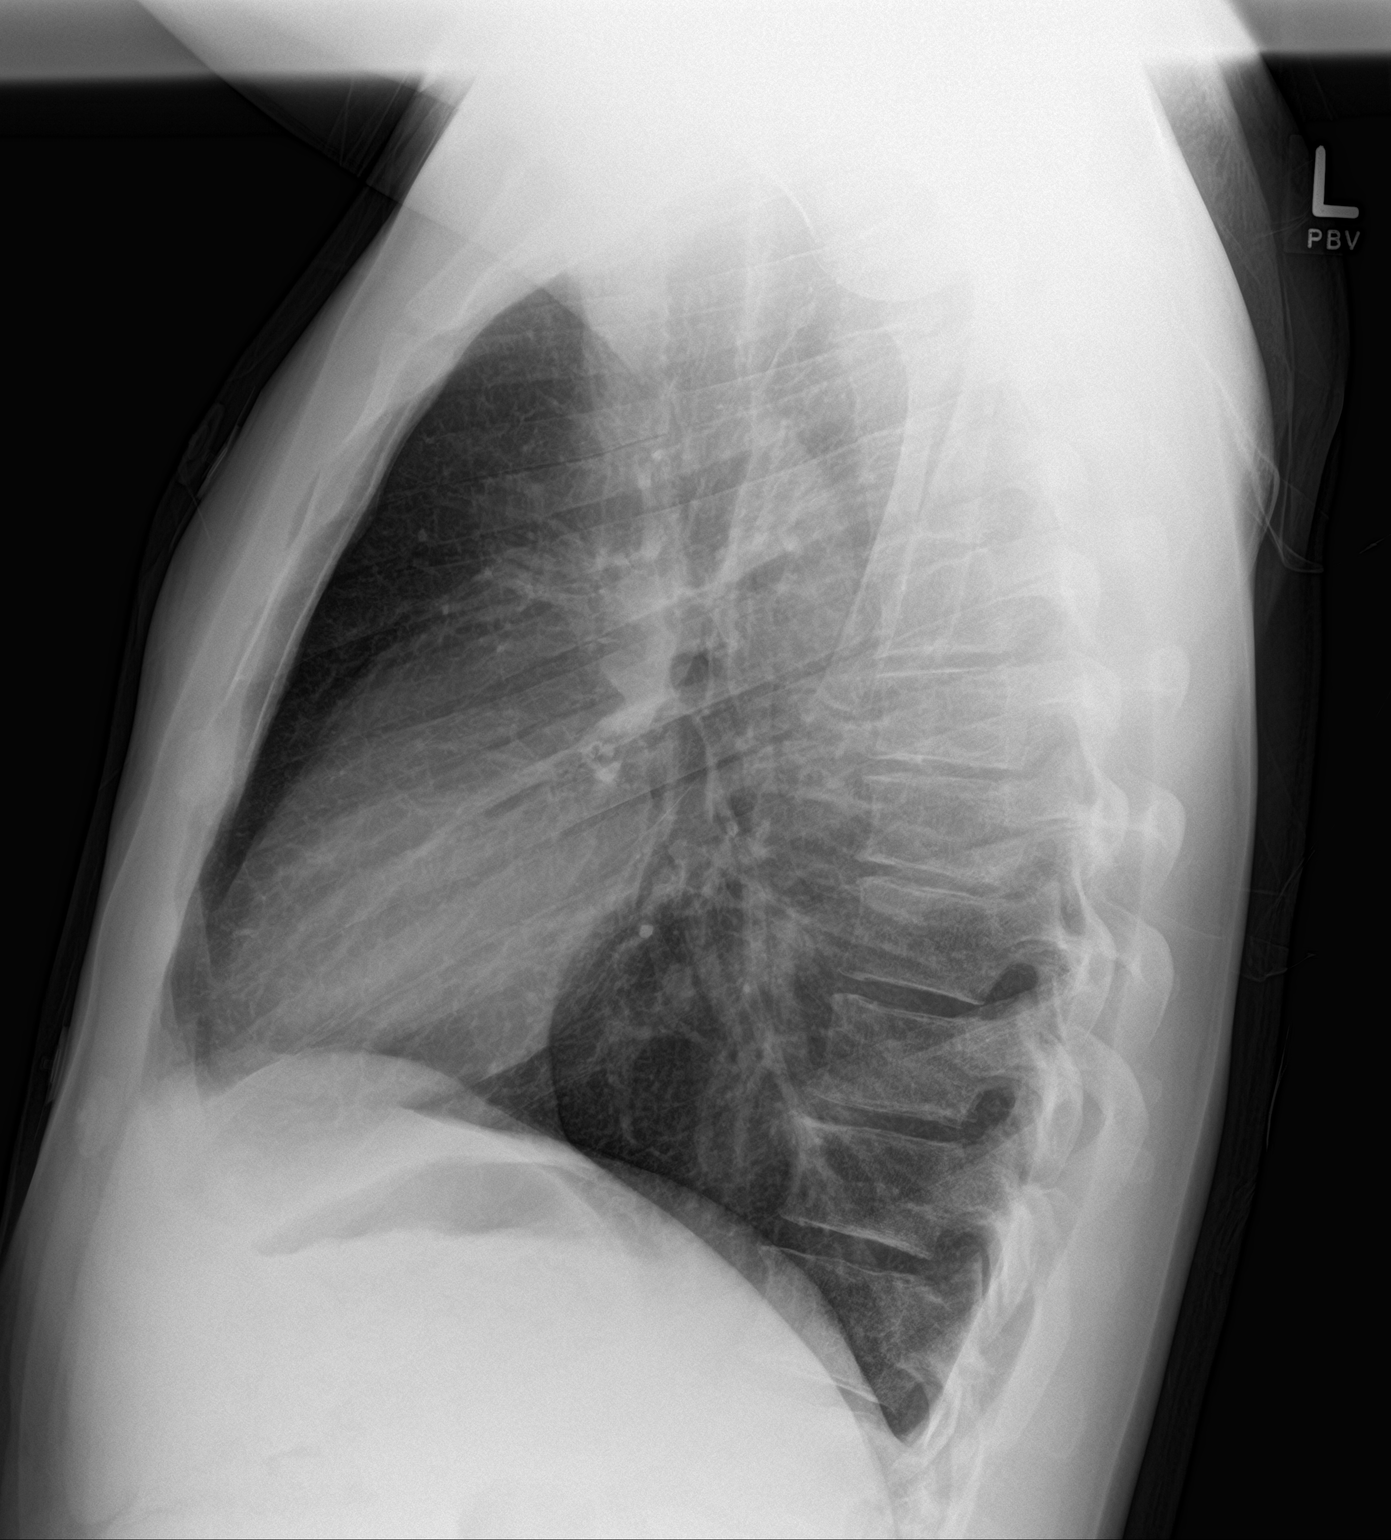

[2 of 2 positions shown; findings below may reference images not displayed]

FINDINGS: The heart size and mediastinal contours are within normal limits.
Both lungs are clear. The visualized skeletal structures are
unremarkable.
IMPRESSION: No active cardiopulmonary disease.
